# Patient Record
Sex: Male | Born: 1985 | Race: White | Hispanic: No | Marital: Married | Smoking: Never smoker
Health system: Southern US, Community
[De-identification: ages and names within clinical notes are randomized; demographics above are authoritative.]

## PROBLEM LIST (undated history)

## (undated) HISTORY — PX: BACK SURGERY: SHX140

---

## 2006-04-29 ENCOUNTER — Emergency Department (HOSPITAL_COMMUNITY): Admission: EM | Admit: 2006-04-29 | Discharge: 2006-04-29 | Payer: Self-pay | Admitting: Emergency Medicine

## 2008-01-29 ENCOUNTER — Ambulatory Visit (HOSPITAL_COMMUNITY): Admission: RE | Admit: 2008-01-29 | Discharge: 2008-01-29 | Payer: Self-pay | Admitting: Specialist

## 2010-12-28 ENCOUNTER — Other Ambulatory Visit: Payer: Self-pay | Admitting: Family Medicine

## 2010-12-28 DIAGNOSIS — IMO0002 Reserved for concepts with insufficient information to code with codable children: Secondary | ICD-10-CM

## 2010-12-28 DIAGNOSIS — M545 Low back pain: Secondary | ICD-10-CM

## 2010-12-29 ENCOUNTER — Ambulatory Visit
Admission: RE | Admit: 2010-12-29 | Discharge: 2010-12-29 | Disposition: A | Discharge status: Home / Self Care | Payer: PRIVATE HEALTH INSURANCE | Source: Ambulatory Visit | Attending: Family Medicine | Admitting: Family Medicine

## 2010-12-29 DIAGNOSIS — M545 Low back pain: Secondary | ICD-10-CM

## 2010-12-29 DIAGNOSIS — IMO0002 Reserved for concepts with insufficient information to code with codable children: Secondary | ICD-10-CM

## 2012-12-02 ENCOUNTER — Encounter (HOSPITAL_COMMUNITY): Payer: Self-pay | Admitting: *Deleted

## 2012-12-02 ENCOUNTER — Emergency Department (HOSPITAL_COMMUNITY)
Admission: EM | Admit: 2012-12-02 | Discharge: 2012-12-02 | Disposition: A | Discharge status: Home / Self Care | Payer: Self-pay | Attending: Emergency Medicine | Admitting: Emergency Medicine

## 2012-12-02 DIAGNOSIS — Y929 Unspecified place or not applicable: Secondary | ICD-10-CM | POA: Insufficient documentation

## 2012-12-02 DIAGNOSIS — S90569A Insect bite (nonvenomous), unspecified ankle, initial encounter: Secondary | ICD-10-CM | POA: Insufficient documentation

## 2012-12-02 DIAGNOSIS — Y939 Activity, unspecified: Secondary | ICD-10-CM | POA: Insufficient documentation

## 2012-12-02 DIAGNOSIS — R509 Fever, unspecified: Secondary | ICD-10-CM | POA: Insufficient documentation

## 2012-12-02 DIAGNOSIS — IMO0001 Reserved for inherently not codable concepts without codable children: Secondary | ICD-10-CM | POA: Insufficient documentation

## 2012-12-02 DIAGNOSIS — W57XXXA Bitten or stung by nonvenomous insect and other nonvenomous arthropods, initial encounter: Secondary | ICD-10-CM | POA: Insufficient documentation

## 2012-12-02 DIAGNOSIS — J029 Acute pharyngitis, unspecified: Secondary | ICD-10-CM | POA: Insufficient documentation

## 2012-12-02 DIAGNOSIS — R21 Rash and other nonspecific skin eruption: Secondary | ICD-10-CM | POA: Insufficient documentation

## 2012-12-02 DIAGNOSIS — B370 Candidal stomatitis: Secondary | ICD-10-CM | POA: Insufficient documentation

## 2012-12-02 LAB — URINALYSIS, ROUTINE W REFLEX MICROSCOPIC
Glucose, UA: NEGATIVE mg/dL
Leukocytes, UA: NEGATIVE
Nitrite: NEGATIVE
Urobilinogen, UA: >8 mg/dL — ABNORMAL HIGH (ref 0.0–1.0)

## 2012-12-02 LAB — COMPREHENSIVE METABOLIC PANEL
ALT: 37 U/L (ref 0–53)
Albumin: 3.6 g/dL (ref 3.5–5.2)
BUN: 8 mg/dL (ref 6–23)
GFR calc Af Amer: >90 mL/min (ref >90)
Glucose, Bld: 97 mg/dL (ref 70–99)
Potassium: 4 mEq/L (ref 3.5–5.1)
Sodium: 137 mEq/L (ref 135–145)
Total Bilirubin: 0.4 mg/dL (ref 0.3–1.2)

## 2012-12-02 LAB — CBC WITH DIFFERENTIAL/PLATELET
Basophils Absolute: 0 10*3/uL (ref 0.0–0.1)
Basophils Relative: 0 % (ref 0–1)
Eosinophils Absolute: 0.1 10*3/uL (ref 0.0–0.7)
HCT: 40.4 % (ref 39.0–52.0)
Lymphs Abs: 1.8 10*3/uL (ref 0.7–4.0)
MCH: 30.2 pg (ref 26.0–34.0)
MCHC: 35.4 g/dL (ref 30.0–36.0)
Monocytes Relative: 16 % — ABNORMAL HIGH (ref 3–12)
RBC: 4.74 MIL/uL (ref 4.22–5.81)
WBC: 6.4 10*3/uL (ref 4.0–10.5)

## 2012-12-02 MED ORDER — MAGIC MOUTHWASH W/LIDOCAINE: 5.0000 mL | Freq: Three times a day (TID) | ORAL | Status: DC

## 2012-12-02 MED ORDER — HYDROCODONE-ACETAMINOPHEN 5-325 MG PO TABS: 1.0000 | ORAL_TABLET | ORAL | Status: DC | PRN

## 2012-12-02 MED ORDER — PREDNISONE 20 MG PO TABS: 20.0000 mg | ORAL_TABLET | Freq: Two times a day (BID) | ORAL | Status: DC

## 2012-12-02 MED ORDER — DEXAMETHASONE SODIUM PHOSPHATE 10 MG/ML IJ SOLN
10.0000 mg | Freq: Once | INTRAMUSCULAR | Status: AC
  Administered 2012-12-02: 10 mg via INTRAVENOUS
  Filled 2012-12-02: qty 1

## 2012-12-02 MED ORDER — KETOROLAC TROMETHAMINE 30 MG/ML IJ SOLN
30.0000 mg | Freq: Once | INTRAMUSCULAR | Status: AC
  Administered 2012-12-02: 30 mg via INTRAVENOUS
  Filled 2012-12-02: qty 1

## 2012-12-02 MED ORDER — NYSTATIN 100000 UNIT/ML MT SUSP: 500000.0000 [IU] | Freq: Four times a day (QID) | OROMUCOSAL | Status: DC

## 2012-12-02 MED ORDER — MAGIC MOUTHWASH
5.0000 mL | Freq: Once | ORAL | Status: DC
  Filled 2012-12-02: qty 5

## 2012-12-02 MED ORDER — DOXYCYCLINE HYCLATE 100 MG PO CAPS: 100.0000 mg | ORAL_CAPSULE | Freq: Two times a day (BID) | ORAL | Status: DC

## 2012-12-02 NOTE — ED Notes (Signed)
The pt has had body aches sorethroat earache abd  Temp headache.  He has also been vomiting.  He has had these symptoms since last Wednesday.  He was seen at an urgent care and given an antibiotic but he is no better

## 2012-12-02 NOTE — ED Notes (Signed)
CBG 118 

## 2012-12-02 NOTE — ED Provider Notes (Signed)
History    CSN: [id number] Arrival date & time 12/02/12  1940  None    Chief Complaint  Patient presents with  . multiple complaints    (Consider location/radiation/quality/duration/timing/severity/associated sxs/prior Treatment) HPI History provided by pt.   Pt developed diffuse body aches and fever 6 days ago.  Max temp 103.  The next day he had vomiting and diarrhea, both of which have resolved.  The day after that, he developed a sore throat and discoloration of tongue.  He was evaluated at ED in [place] over the weekend, screened neg for strep phayngitis and treated w/ amoxicillin for viral illness.  He comes to ED today because he is not feeling any better despite compliance w/ abx; the odynophagia is particularly bothersome.  He denies cough, SOB, abd pain, urinary sx, rash.  He has no PMH.  Gets tick bites frequently.  No known sick contacts.  No recent travel.  Has one sexual partner and denies IV drug abuse.  History reviewed. No pertinent past medical history. History reviewed. No pertinent past surgical history. No family history on file. History  Substance Use Topics  . Smoking status: Never Smoker   . Smokeless tobacco: Not on file  . Alcohol Use: Yes    Review of Systems  All other systems reviewed and are negative.    Allergies  Review of patient's allergies indicates no known allergies.  Home Medications   Current Outpatient Rx  Name  Route  Sig  Dispense  Refill  . acetaminophen (TYLENOL) 500 MG tablet   Oral   Take 1,000 mg by mouth every 6 (six) hours as needed for pain. For pain         . amoxicillin (AMOXIL) 500 MG capsule   Oral   Take 500 mg by mouth 3 (three) times daily. Take for 10 days. Began on 11/29/12.         Marland Kitchen ibuprofen (ADVIL,MOTRIN) 200 MG tablet   Oral   Take 400 mg by mouth every 6 (six) hours as needed for pain. For pain         . doxycycline (VIBRAMYCIN) 100 MG capsule   Oral   Take 1 capsule (100 mg total) by mouth  2 (two) times daily.   20 capsule   0   . HYDROcodone-acetaminophen (NORCO/VICODIN) 5-325 MG per tablet   Oral   Take 1 tablet by mouth every 4 (four) hours as needed for pain.   15 tablet   0   . nystatin (MYCOSTATIN) 100000 UNIT/ML suspension   Oral   Take 5 mLs (500,000 Units total) by mouth 4 (four) times daily.   140 mL   0   . predniSONE (DELTASONE) 20 MG tablet   Oral   Take 1 tablet (20 mg total) by mouth 2 (two) times daily.   10 tablet   0    BP 117/76  Pulse 92  Temp(Src) 100.1 F (37.8 C) (Oral)  Resp 18  SpO2 98% Physical Exam  Nursing note and vitals reviewed. Constitutional: He is oriented to person, place, and time. He appears well-developed and well-nourished. No distress.  HENT:  Head: Normocephalic and atraumatic.  Thrush on borders of tongue.  Erythema soft palate, tonsils and posterior pharynx.  Exudate right tonsil.  No tonsillar edema.  Eyes:  Normal appearance  Neck: Normal range of motion.  Cardiovascular: Normal rate and regular rhythm.   Pulmonary/Chest: Effort normal and breath sounds normal. No respiratory distress.  Abdominal: Soft. Bowel sounds  are normal. He exhibits no distension. There is no tenderness.  Musculoskeletal: Normal range of motion.  Lymphadenopathy:    He has no cervical adenopathy.  Neurological: He is alert and oriented to person, place, and time.  Skin: Skin is warm and dry. No rash noted.  Multiple discrete, macular lesions, each ~0.5cm on palms of hands.  There are three tiny ticks, 1mm in size, on anterior left lower leg.  His girlfriend scraped them off while I was examining him.  No other obvious tick bites.   Psychiatric: He has a normal mood and affect. His behavior is normal.    ED Course  Procedures (including critical care time) Labs Reviewed  CBC WITH DIFFERENTIAL - Abnormal; Notable for the following:    Monocytes Relative 16 (*)    All other components within normal limits  COMPREHENSIVE METABOLIC  PANEL - Abnormal; Notable for the following:    Alkaline Phosphatase 150 (*)    All other components within normal limits  URINALYSIS, ROUTINE W REFLEX MICROSCOPIC - Abnormal; Notable for the following:    APPearance HAZY (*)    Bilirubin Urine SMALL (*)    Ketones, ur 15 (*)    Urobilinogen, UA >8.0 (*)    All other components within normal limits  GLUCOSE, CAPILLARY - Abnormal; Notable for the following:    Glucose-Capillary 118 (*)    All other components within normal limits  LIPASE, BLOOD  RAPID HIV SCREEN ([facility])   No results found. 1. Thrush   2. Tick bites   3. Pharyngitis     MDM  27yo previously healthy M presents w/ fever, myalgias, sore throat x 6d.  Currently being treated w/ amoxicillin w/out improvement in sx.  Exam sig for low grade fever, thrush of tongue, injection posterior pharynx and exudate of tonsils, macular rash of hands and tick bites to LLE.  Labs sig for elevated monocytes and alk phos.  Will treat empirically for tick-borne illness w/ doxy.  Pt will continue his amoxicillin for possible strep.  I recommended oral hydration, avoidance of kissing/sharing drinks as well as contact sports for possibility of mono, and tested him for HIV.  He received a dose of IV decadron and toradol in ED and I prescribed him oral nystatin, vicodin and prednisone.  Case discussed w/ Dr. Jeraldine Loots and he is agreement w/ plan.  Pt comfortable w/ plan as well.  Return precautions discussed.  11:46 PM   Otilio Miu, PA-C 12/02/12 2346  Otilio Miu, PA-C 12/02/12 [phone]

## 2012-12-03 LAB — RAPID HIV SCREEN (WH-MAU): Rapid HIV Screen: NONREACTIVE

## 2012-12-03 NOTE — ED Provider Notes (Signed)
  Medical screening examination/treatment/procedure(s) were performed by non-physician practitioner and as supervising physician I was immediately available for consultation/collaboration.    Gerhard Munch, MD 12/03/12 [phone]

## 2012-12-12 ENCOUNTER — Telehealth (HOSPITAL_COMMUNITY): Payer: Self-pay | Admitting: *Deleted

## 2015-01-21 ENCOUNTER — Other Ambulatory Visit: Payer: Self-pay | Admitting: Rehabilitation

## 2015-01-21 DIAGNOSIS — M5137 Other intervertebral disc degeneration, lumbosacral region: Secondary | ICD-10-CM

## 2015-02-04 ENCOUNTER — Ambulatory Visit
Admission: RE | Admit: 2015-02-04 | Discharge: 2015-02-04 | Disposition: A | Discharge status: Home / Self Care | Payer: 59 | Source: Ambulatory Visit | Attending: Rehabilitation | Admitting: Rehabilitation

## 2015-02-04 DIAGNOSIS — M5137 Other intervertebral disc degeneration, lumbosacral region: Secondary | ICD-10-CM

## 2015-06-17 ENCOUNTER — Telehealth: Payer: Self-pay | Admitting: Vascular Surgery

## 2015-06-17 NOTE — Telephone Encounter (Signed)
-----   Message from Glori Bickers, RN sent at 06/16/2015  3:45 PM EST ----- Regarding: OV with [facility] Mr. Galluzzo is scheduled for ALIF L5-S1 on Tuesday, 2/21, with Drs. Pool and [employer]. Will you please schedule an OV with Dr. Edilia Bo prior to this surgery date? Also, patient will need to bring films to appointment.   Thanks, Judeth Cornfield

## 2015-06-17 NOTE — Telephone Encounter (Signed)
Spoke with pt, dpm °

## 2015-06-18 ENCOUNTER — Other Ambulatory Visit (HOSPITAL_COMMUNITY): Payer: Self-pay | Admitting: Neurosurgery

## 2015-06-24 ENCOUNTER — Encounter: Payer: Self-pay | Admitting: Vascular Surgery

## 2015-06-25 ENCOUNTER — Other Ambulatory Visit (HOSPITAL_COMMUNITY): Payer: Self-pay

## 2015-06-28 ENCOUNTER — Inpatient Hospital Stay (HOSPITAL_COMMUNITY): Admission: RE | Admit: 2015-06-28 | Payer: Self-pay | Source: Ambulatory Visit

## 2015-06-28 ENCOUNTER — Telehealth: Payer: Self-pay | Admitting: Vascular Surgery

## 2015-06-28 NOTE — Telephone Encounter (Signed)
-----   Message from Glori Bickers, RN sent at 06/25/2015  3:58 PM EST ----- Regarding: ALIF 07/08/15 FYI: Erie Noe from Dr. Ethelene Browns office called stating that Mr. Grygiel ALIF has been cancelled on 07/08/15. It was denied by the patients insurance.   Can we cancel his OV with Dr. Edilia Bo scheduled for 2/15?  Thanks, Judeth Cornfield

## 2015-06-29 ENCOUNTER — Encounter: Payer: Self-pay | Admitting: Vascular Surgery

## 2015-06-30 ENCOUNTER — Encounter: Payer: Self-pay | Admitting: Vascular Surgery

## 2015-07-08 ENCOUNTER — Inpatient Hospital Stay (HOSPITAL_COMMUNITY): Admission: RE | Admit: 2015-07-08 | Payer: BLUE CROSS/BLUE SHIELD | Source: Ambulatory Visit | Admitting: Neurosurgery

## 2015-07-08 ENCOUNTER — Encounter (HOSPITAL_COMMUNITY): Admission: RE | Payer: Self-pay | Source: Ambulatory Visit

## 2015-07-08 SURGERY — ANTERIOR LUMBAR FUSION 1 LEVEL
Anesthesia: General

## 2016-04-28 ENCOUNTER — Emergency Department (HOSPITAL_COMMUNITY): Payer: BLUE CROSS/BLUE SHIELD

## 2016-04-28 ENCOUNTER — Encounter (HOSPITAL_COMMUNITY): Payer: Self-pay | Admitting: Emergency Medicine

## 2016-04-28 ENCOUNTER — Emergency Department (HOSPITAL_COMMUNITY)
Admission: EM | Admit: 2016-04-28 | Discharge: 2016-04-28 | Disposition: A | Discharge status: Home / Self Care | Payer: BLUE CROSS/BLUE SHIELD | Attending: Emergency Medicine | Admitting: Emergency Medicine

## 2016-04-28 DIAGNOSIS — K219 Gastro-esophageal reflux disease without esophagitis: Secondary | ICD-10-CM | POA: Diagnosis not present

## 2016-04-28 DIAGNOSIS — R079 Chest pain, unspecified: Secondary | ICD-10-CM | POA: Insufficient documentation

## 2016-04-28 DIAGNOSIS — R0602 Shortness of breath: Secondary | ICD-10-CM | POA: Diagnosis present

## 2016-04-28 LAB — D-DIMER, QUANTITATIVE: D-Dimer, Quant: 0.36 ug/mL-FEU (ref 0.00–0.50)

## 2016-04-28 LAB — COMPREHENSIVE METABOLIC PANEL
ALK PHOS: 57 U/L (ref 38–126)
ALT: 22 U/L (ref 17–63)
AST: 29 U/L (ref 15–41)
Albumin: 3.8 g/dL (ref 3.5–5.0)
Anion gap: 11 (ref 5–15)
BUN: 11 mg/dL (ref 6–20)
CHLORIDE: 101 mmol/L (ref 101–111)
CO2: 24 mmol/L (ref 22–32)
CREATININE: 1.02 mg/dL (ref 0.61–1.24)
Calcium: 9.3 mg/dL (ref 8.9–10.3)
GFR calc Af Amer: >60 mL/min (ref >60)
GFR calc non Af Amer: >60 mL/min (ref >60)
GLUCOSE: 160 mg/dL — AB (ref 65–99)
Potassium: 3.4 mmol/L — ABNORMAL LOW (ref 3.5–5.1)
SODIUM: 136 mmol/L (ref 135–145)
Total Bilirubin: 0.4 mg/dL (ref 0.3–1.2)
Total Protein: 6.3 g/dL — ABNORMAL LOW (ref 6.5–8.1)

## 2016-04-28 LAB — CBC WITH DIFFERENTIAL/PLATELET
BASOS ABS: 0 10*3/uL (ref 0.0–0.1)
Basophils Relative: 0 %
EOS ABS: 0.1 10*3/uL (ref 0.0–0.7)
EOS PCT: 1 %
HCT: 40 % (ref 39.0–52.0)
HEMOGLOBIN: 13.9 g/dL (ref 13.0–17.0)
LYMPHS PCT: 8 %
Lymphs Abs: 0.8 10*3/uL (ref 0.7–4.0)
MCH: 30 pg (ref 26.0–34.0)
MCHC: 34.8 g/dL (ref 30.0–36.0)
MCV: 86.2 fL (ref 78.0–100.0)
Monocytes Absolute: 0.8 10*3/uL (ref 0.1–1.0)
Monocytes Relative: 8 %
NEUTROS PCT: 83 %
Neutro Abs: 8.3 10*3/uL — ABNORMAL HIGH (ref 1.7–7.7)
PLATELETS: 189 10*3/uL (ref 150–400)
RBC: 4.64 MIL/uL (ref 4.22–5.81)
RDW: 12.6 % (ref 11.5–15.5)
WBC: 10 10*3/uL (ref 4.0–10.5)

## 2016-04-28 LAB — I-STAT TROPONIN, ED: Troponin i, poc: 0 ng/mL (ref 0.00–0.08)

## 2016-04-28 LAB — LIPASE, BLOOD: Lipase: 17 U/L (ref 11–51)

## 2016-04-28 MED ORDER — FAMOTIDINE IN NACL 20-0.9 MG/50ML-% IV SOLN
20.0000 mg | Freq: Once | INTRAVENOUS | Status: AC
  Administered 2016-04-28: 20 mg via INTRAVENOUS
  Filled 2016-04-28: qty 50

## 2016-04-28 MED ORDER — PANTOPRAZOLE SODIUM 20 MG PO TBEC: 20.0000 mg | DELAYED_RELEASE_TABLET | Freq: Every day | ORAL | 3 refills | Status: AC

## 2016-04-28 MED ORDER — LORAZEPAM 2 MG/ML IJ SOLN
1.0000 mg | Freq: Once | INTRAMUSCULAR | Status: AC
  Administered 2016-04-28: 1 mg via INTRAVENOUS
  Filled 2016-04-28: qty 1

## 2016-04-28 MED ORDER — SUCRALFATE 1 GM/10ML PO SUSP: 1.0000 g | Freq: Three times a day (TID) | ORAL | 0 refills | Status: AC

## 2016-04-28 MED ORDER — GI COCKTAIL ~~LOC~~
30.0000 mL | Freq: Once | ORAL | Status: AC
  Administered 2016-04-28: 30 mL via ORAL
  Filled 2016-04-28: qty 30

## 2016-04-28 NOTE — ED Triage Notes (Addendum)
Pt stated he was woken up by the chest pain. Pt states he has a back problems and has medication for same. He took some of the medication and it took the edge off the pain. Pt just started a new medication for his back called duloxetine HCL and he took it at 2000 hrs on 04/27/16. 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

## 2016-04-28 NOTE — ED Notes (Signed)
Patient transported to X-ray 

## 2016-04-28 NOTE — ED Provider Notes (Signed)
[facility]-EMERGENCY DEPT Provider Note   CSN: [id number] Arrival date & time: 04/28/16  0359     History   Chief Complaint Chief Complaint  Patient presents with  . Chest Pain    HPI Nathan Nichols is a 30 y.o. male.  Patient presents to the emergency department for evaluation of chest pain. Patient reports that he was awakened by a heaviness in the center of his chest. He reports associated shortness of breath. He has a history of chronic back pain, took his normal dose of hydrocodone and it helped the pain but he is still feeling some pain. Patient reports that he was sick all day yesterday with nausea and vomiting, none today.      History reviewed. No pertinent past medical history.  There are no active problems to display for this patient.   History reviewed. No pertinent surgical history.     Home Medications    Prior to Admission medications   Medication Sig Start Date End Date Taking? Authorizing Provider  DULoxetine (CYMBALTA) 30 MG capsule Take 30 mg by mouth daily. 04/19/16  Yes Historical Provider, MD  HYDROcodone-acetaminophen (NORCO) 10-325 MG tablet Take 1 tablet by mouth every 6 (six) hours as needed for moderate pain.  04/17/16  Yes Historical Provider, MD  pantoprazole (PROTONIX) 20 MG tablet Take 1 tablet (20 mg total) by mouth daily. 04/28/16   Gilda Creasehristopher J Emalynn Clewis, MD  sucralfate (CARAFATE) 1 GM/10ML suspension Take 10 mLs (1 g total) by mouth 4 (four) times daily -  with meals and at bedtime. 04/28/16   Gilda Creasehristopher J Zophia Marrone, MD    Family History No family history on file.  Social History Social History  Substance Use Topics  . Smoking status: Never Smoker  . Smokeless tobacco: Current User    Types: Snuff  . Alcohol use Yes     Allergies   Patient has no known allergies.   Review of Systems Review of Systems  Respiratory: Positive for shortness of breath.   Cardiovascular: Positive for chest pain.  Gastrointestinal: Positive for  nausea and vomiting.  All other systems reviewed and are negative.    Physical Exam Updated Vital Signs BP 113/71   Pulse 90   Temp 98.4 F (36.9 C) (Oral)   Resp 14   Ht 5\' 11"  (1.803 m)   Wt 148 lb (67.1 kg)   SpO2 92%   BMI 20.64 kg/m   Physical Exam  Constitutional: He is oriented to person, place, and time. He appears well-developed and well-nourished. No distress.  HENT:  Head: Normocephalic and atraumatic.  Right Ear: Hearing normal.  Left Ear: Hearing normal.  Nose: Nose normal.  Mouth/Throat: Oropharynx is clear and moist and mucous membranes are normal.  Eyes: Conjunctivae and EOM are normal. Pupils are equal, round, and reactive to light.  Neck: Normal range of motion. Neck supple.  Cardiovascular: Regular rhythm, S1 normal and S2 normal.  Exam reveals no gallop and no friction rub.   No murmur heard. Pulmonary/Chest: Effort normal and breath sounds normal. No respiratory distress. He exhibits no tenderness.  Abdominal: Soft. Normal appearance and bowel sounds are normal. There is no hepatosplenomegaly. There is no tenderness. There is no rebound, no guarding, no tenderness at McBurney's point and negative Murphy's sign. No hernia.  Musculoskeletal: Normal range of motion.  Neurological: He is alert and oriented to person, place, and time. He has normal strength. No cranial nerve deficit or sensory deficit. Coordination normal. GCS eye subscore is 4. GCS  verbal subscore is 5. GCS motor subscore is 6.  Skin: Skin is warm, dry and intact. No rash noted. No cyanosis.  Psychiatric: He has a normal mood and affect. His speech is normal and behavior is normal. Thought content normal.  Nursing note and vitals reviewed.    ED Treatments / Results  Labs (all labs ordered are listed, but only abnormal results are displayed) Labs Reviewed  CBC WITH DIFFERENTIAL/PLATELET - Abnormal; Notable for the following:       Result Value   Neutro Abs 8.3 (*)    All other  components within normal limits  COMPREHENSIVE METABOLIC PANEL - Abnormal; Notable for the following:    Potassium 3.4 (*)    Glucose, Bld 160 (*)    Total Protein 6.3 (*)    All other components within normal limits  LIPASE, BLOOD  D-DIMER, QUANTITATIVE (NOT AT [facility])  I-STAT TROPOININ, ED    EKG  EKG Interpretation  Date/Time:  Friday April 28 2016 04:04:00 EST Ventricular Rate:  102 PR Interval:    QRS Duration: 83 QT Interval:  327 QTC Calculation: 426 R Axis:   71 Text Interpretation:  Sinus tachycardia Otherwise within normal limits Confirmed by Poseidon Pam  MD, Lessie Funderburke [phone]) on 04/28/2016 4:20:06 AM       Radiology Dg Chest 2 View  Result Date: 04/28/2016 CLINICAL DATA:  30 year old male with chest pain EXAM: CHEST  2 VIEW COMPARISON:  Chest radiograph dated 04/29/2006 FINDINGS: The lungs are clear. There is no pleural effusion or pneumothorax. The cardiac silhouette is within normal limits. Stable compression deformity and anterior wedging of the T8 vertebra. No acute osseous pathology. IMPRESSION: No active cardiopulmonary disease. Electronically Signed   By: Elgie Collard M.D.   On: 04/28/2016 04:59    Procedures Procedures (including critical care time)  Medications Ordered in ED Medications  famotidine (PEPCID) IVPB 20 mg premix (0 mg Intravenous Stopped 04/28/16 0522)  gi cocktail (Maalox,Lidocaine,Donnatal) (30 mLs Oral Given 04/28/16 0419)  LORazepam (ATIVAN) injection 1 mg (1 mg Intravenous Given 04/28/16 0536)     Initial Impression / Assessment and Plan / ED Course  I have reviewed the triage vital signs and the nursing notes.  Pertinent labs & imaging results that were available during my care of the patient were reviewed by me and considered in my medical decision making (see chart for details).  Clinical Course     Patient presents to the department with complaints of chest pain. Patient reports the pain began overnight, awakens him from  sleep. He has had a similar episode years ago secondary to GERD. He was previously on Protonix, but has not been on it for a while, has not had any reflux issues.  Patient does not have any cardiac risk factors. EKG does not show evidence of ischemia or infarct. Troponin is negative. Patient also has a negative d-dimer. Patient administered GI cocktail and Pepcid. He continued to have discomfort. Patient appeared to be very anxious. He was given Ativan and his symptoms have now resolved. Suspected there was reflux with some element of esophageal spasm. Patient appropriate for discharge. Heart Score is 0.  Final Clinical Impressions(s) / ED Diagnoses   Final diagnoses:  Chest pain, unspecified type  Gastroesophageal reflux disease, esophagitis presence not specified    New Prescriptions New Prescriptions   PANTOPRAZOLE (PROTONIX) 20 MG TABLET    Take 1 tablet (20 mg total) by mouth daily.   SUCRALFATE (CARAFATE) 1 GM/10ML SUSPENSION    Take 10  mLs (1 g total) by mouth 4 (four) times daily -  with meals and at bedtime.     Gilda Creasehristopher J Antwoine Zorn, MD 04/28/16 [phone]

## 2017-05-22 DIAGNOSIS — M5417 Radiculopathy, lumbosacral region: Secondary | ICD-10-CM | POA: Diagnosis not present

## 2017-05-22 DIAGNOSIS — M5137 Other intervertebral disc degeneration, lumbosacral region: Secondary | ICD-10-CM | POA: Diagnosis not present

## 2017-06-25 DIAGNOSIS — M5137 Other intervertebral disc degeneration, lumbosacral region: Secondary | ICD-10-CM | POA: Diagnosis not present

## 2017-06-25 DIAGNOSIS — M5417 Radiculopathy, lumbosacral region: Secondary | ICD-10-CM | POA: Diagnosis not present

## 2017-06-25 DIAGNOSIS — R030 Elevated blood-pressure reading, without diagnosis of hypertension: Secondary | ICD-10-CM | POA: Diagnosis not present

## 2017-07-19 DIAGNOSIS — M5417 Radiculopathy, lumbosacral region: Secondary | ICD-10-CM | POA: Diagnosis not present

## 2017-08-09 DIAGNOSIS — R030 Elevated blood-pressure reading, without diagnosis of hypertension: Secondary | ICD-10-CM | POA: Diagnosis not present

## 2017-08-09 DIAGNOSIS — M5417 Radiculopathy, lumbosacral region: Secondary | ICD-10-CM | POA: Diagnosis not present

## 2017-08-09 DIAGNOSIS — M5137 Other intervertebral disc degeneration, lumbosacral region: Secondary | ICD-10-CM | POA: Diagnosis not present

## 2017-09-13 DIAGNOSIS — M5417 Radiculopathy, lumbosacral region: Secondary | ICD-10-CM | POA: Diagnosis not present

## 2017-09-13 DIAGNOSIS — Z135 Encounter for screening for eye and ear disorders: Secondary | ICD-10-CM | POA: Diagnosis not present

## 2017-09-13 DIAGNOSIS — M545 Low back pain: Secondary | ICD-10-CM | POA: Diagnosis not present

## 2017-09-14 DIAGNOSIS — R030 Elevated blood-pressure reading, without diagnosis of hypertension: Secondary | ICD-10-CM | POA: Diagnosis not present

## 2017-09-18 DIAGNOSIS — M48062 Spinal stenosis, lumbar region with neurogenic claudication: Secondary | ICD-10-CM | POA: Diagnosis not present

## 2017-09-18 DIAGNOSIS — R030 Elevated blood-pressure reading, without diagnosis of hypertension: Secondary | ICD-10-CM | POA: Diagnosis not present

## 2018-01-04 DIAGNOSIS — M5137 Other intervertebral disc degeneration, lumbosacral region: Secondary | ICD-10-CM | POA: Diagnosis not present

## 2018-01-04 DIAGNOSIS — R030 Elevated blood-pressure reading, without diagnosis of hypertension: Secondary | ICD-10-CM | POA: Diagnosis not present

## 2018-01-04 DIAGNOSIS — M48062 Spinal stenosis, lumbar region with neurogenic claudication: Secondary | ICD-10-CM | POA: Diagnosis not present

## 2018-02-12 DIAGNOSIS — Z0000 Encounter for general adult medical examination without abnormal findings: Secondary | ICD-10-CM | POA: Diagnosis not present

## 2018-02-12 DIAGNOSIS — Z23 Encounter for immunization: Secondary | ICD-10-CM | POA: Diagnosis not present

## 2018-02-12 DIAGNOSIS — R030 Elevated blood-pressure reading, without diagnosis of hypertension: Secondary | ICD-10-CM | POA: Diagnosis not present

## 2018-02-13 DIAGNOSIS — Z0000 Encounter for general adult medical examination without abnormal findings: Secondary | ICD-10-CM | POA: Diagnosis not present

## 2018-02-26 DIAGNOSIS — M4807 Spinal stenosis, lumbosacral region: Secondary | ICD-10-CM | POA: Diagnosis not present

## 2018-02-26 DIAGNOSIS — M48062 Spinal stenosis, lumbar region with neurogenic claudication: Secondary | ICD-10-CM | POA: Diagnosis not present

## 2018-02-26 DIAGNOSIS — Z01812 Encounter for preprocedural laboratory examination: Secondary | ICD-10-CM | POA: Diagnosis not present

## 2018-02-26 DIAGNOSIS — M5137 Other intervertebral disc degeneration, lumbosacral region: Secondary | ICD-10-CM | POA: Diagnosis not present

## 2018-03-05 DIAGNOSIS — M5417 Radiculopathy, lumbosacral region: Secondary | ICD-10-CM | POA: Diagnosis not present

## 2018-03-05 DIAGNOSIS — M5127 Other intervertebral disc displacement, lumbosacral region: Secondary | ICD-10-CM | POA: Diagnosis not present

## 2018-03-05 DIAGNOSIS — M5117 Intervertebral disc disorders with radiculopathy, lumbosacral region: Secondary | ICD-10-CM | POA: Diagnosis not present

## 2018-03-05 DIAGNOSIS — M4807 Spinal stenosis, lumbosacral region: Secondary | ICD-10-CM | POA: Diagnosis not present

## 2018-03-10 ENCOUNTER — Emergency Department (HOSPITAL_COMMUNITY): Payer: BLUE CROSS/BLUE SHIELD

## 2018-03-10 ENCOUNTER — Other Ambulatory Visit: Payer: Self-pay

## 2018-03-10 ENCOUNTER — Emergency Department (HOSPITAL_COMMUNITY)
Admission: EM | Admit: 2018-03-10 | Discharge: 2018-03-10 | Disposition: A | Discharge status: Home / Self Care | Payer: BLUE CROSS/BLUE SHIELD | Attending: Emergency Medicine | Admitting: Emergency Medicine

## 2018-03-10 ENCOUNTER — Encounter (HOSPITAL_COMMUNITY): Payer: Self-pay

## 2018-03-10 DIAGNOSIS — K5900 Constipation, unspecified: Secondary | ICD-10-CM

## 2018-03-10 DIAGNOSIS — Z79899 Other long term (current) drug therapy: Secondary | ICD-10-CM | POA: Diagnosis not present

## 2018-03-10 DIAGNOSIS — K5641 Fecal impaction: Secondary | ICD-10-CM | POA: Diagnosis not present

## 2018-03-10 DIAGNOSIS — R1110 Vomiting, unspecified: Secondary | ICD-10-CM | POA: Insufficient documentation

## 2018-03-10 DIAGNOSIS — F5089 Other specified eating disorder: Secondary | ICD-10-CM | POA: Diagnosis not present

## 2018-03-10 LAB — CBC WITH DIFFERENTIAL/PLATELET
Abs Immature Granulocytes: 0.07 10*3/uL (ref 0.00–0.07)
Basophils Absolute: 0 10*3/uL (ref 0.0–0.1)
Basophils Relative: 0 %
EOS PCT: 1 %
Eosinophils Absolute: 0.1 10*3/uL (ref 0.0–0.5)
HEMATOCRIT: 39 % (ref 39.0–52.0)
HEMOGLOBIN: 12.7 g/dL — AB (ref 13.0–17.0)
Immature Granulocytes: 1 %
Lymphocytes Relative: 17 %
Lymphs Abs: 1.8 10*3/uL (ref 0.7–4.0)
MCH: 29.3 pg (ref 26.0–34.0)
MCHC: 32.6 g/dL (ref 30.0–36.0)
MCV: 90.1 fL (ref 80.0–100.0)
Monocytes Absolute: 1 10*3/uL (ref 0.1–1.0)
Monocytes Relative: 9 %
NEUTROS PCT: 72 %
NRBC: 0 % (ref 0.0–0.2)
Neutro Abs: 7.8 10*3/uL — ABNORMAL HIGH (ref 1.7–7.7)
PLATELETS: 284 10*3/uL (ref 150–400)
RBC: 4.33 MIL/uL (ref 4.22–5.81)
RDW: 12.2 % (ref 11.5–15.5)
WBC: 10.7 10*3/uL — AB (ref 4.0–10.5)

## 2018-03-10 LAB — COMPREHENSIVE METABOLIC PANEL
ALT: 72 U/L — AB (ref 0–44)
AST: 19 U/L (ref 15–41)
Albumin: 3.8 g/dL (ref 3.5–5.0)
Alkaline Phosphatase: 96 U/L (ref 38–126)
Anion gap: 8 (ref 5–15)
BUN: 20 mg/dL (ref 6–20)
CO2: 28 mmol/L (ref 22–32)
Calcium: 9.2 mg/dL (ref 8.9–10.3)
Chloride: 102 mmol/L (ref 98–111)
Creatinine, Ser: 0.87 mg/dL (ref 0.61–1.24)
Glucose, Bld: 96 mg/dL (ref 70–99)
POTASSIUM: 4 mmol/L (ref 3.5–5.1)
SODIUM: 138 mmol/L (ref 135–145)
Total Bilirubin: 0.7 mg/dL (ref 0.3–1.2)
Total Protein: 7.2 g/dL (ref 6.5–8.1)

## 2018-03-10 LAB — LIPASE, BLOOD: LIPASE: 25 U/L (ref 11–51)

## 2018-03-10 MED ORDER — MAGNESIUM CITRATE PO SOLN: 1.0000 | Freq: Once | ORAL | 0 refills | Status: AC

## 2018-03-10 MED ORDER — POLYETHYLENE GLYCOL 3350 17 G PO PACK: 17.0000 g | PACK | Freq: Every day | ORAL | 0 refills | Status: DC

## 2018-03-10 MED ORDER — MAGNESIUM CITRATE PO SOLN: 1.0000 | Freq: Once | ORAL | 0 refills | Status: DC

## 2018-03-10 MED ORDER — HYDROMORPHONE HCL 1 MG/ML IJ SOLN
1.0000 mg | Freq: Once | INTRAMUSCULAR | Status: AC
  Administered 2018-03-10: 1 mg via INTRAVENOUS
  Filled 2018-03-10: qty 1

## 2018-03-10 MED ORDER — POLYETHYLENE GLYCOL 3350 17 G PO PACK: 17.0000 g | PACK | Freq: Every day | ORAL | 0 refills | Status: AC

## 2018-03-10 MED ORDER — OXYCODONE-ACETAMINOPHEN 5-325 MG PO TABS: 2.0000 | ORAL_TABLET | ORAL | 0 refills | Status: AC | PRN

## 2018-03-10 MED ORDER — SODIUM CHLORIDE 0.9 % IV BOLUS
1000.0000 mL | Freq: Once | INTRAVENOUS | Status: AC
  Administered 2018-03-10: 1000 mL via INTRAVENOUS

## 2018-03-10 MED ORDER — OXYCODONE-ACETAMINOPHEN 5-325 MG PO TABS: 2.0000 | ORAL_TABLET | ORAL | 0 refills | Status: DC | PRN

## 2018-03-10 MED ORDER — IOPAMIDOL (ISOVUE-300) INJECTION 61%
100.0000 mL | Freq: Once | INTRAVENOUS | Status: AC | PRN
  Administered 2018-03-10: 100 mL via INTRAVENOUS

## 2018-03-10 MED ORDER — MAGNESIUM CITRATE PO SOLN
1.0000 | Freq: Once | ORAL | Status: DC
Start: 2018-03-10 — End: 2018-03-10

## 2018-03-10 MED ORDER — OXYCODONE-ACETAMINOPHEN 5-325 MG PO TABS
2.0000 | ORAL_TABLET | Freq: Once | ORAL | Status: AC
  Administered 2018-03-10: 2 via ORAL
  Filled 2018-03-10: qty 2

## 2018-03-10 NOTE — ED Notes (Signed)
Pt transferred to CT.

## 2018-03-10 NOTE — Discharge Instructions (Addendum)
I have provided a prescription for magnesium citrate please drink the whole bottle today to help with constipation. I have also provided a script for Miralax which you should use regularly daily for the next 7 days. I have also provided a prescription for the percocet, please call your surgeon to get this medication fully refilled. Know that this medication is not helping the constipation and could be worsening your constipation.

## 2018-03-10 NOTE — ED Provider Notes (Signed)
[facility] EMERGENCY DEPARTMENT Provider Note   CSN: [id number] Arrival date & time: 03/10/18  0701     History   Chief Complaint Chief Complaint  Patient presents with  . Constipation  . Emesis    HPI Nathan Nichols is a 32 y.o. male.  32 y.o male with no PMH presents to the ED with a chief complaint of constipation x 6 days.  Patient had a lumbar fusion done on Tuesday 03/05/2018 by Dr. Dutch Quint and reports he was prescribed Percocet every 4 hours along with Valium every 6 hours for his pain.  She was also discharged on Colace and has been taking it daily.  Reports being unable to have a bowel movement since Monday prior to the surgery.  He contacted his physician yesterday who advised him to take Dulcolax x2 for his constipation. Patient reports emesis since yesterday, he reports he is unable to have any intake or liquids or solids without vomiting them.  Reports he is not passing gas at this point.  Denies any fever, urinary symptoms, chest pain, or shortness of breath.     History reviewed. No pertinent past medical history.  There are no active problems to display for this patient.   Past Surgical History:  Procedure Laterality Date  . BACK SURGERY          Home Medications    Prior to Admission medications   Medication Sig Start Date End Date Taking? Authorizing Provider  DULoxetine (CYMBALTA) 30 MG capsule Take 30 mg by mouth daily. 04/19/16   [provider]  HYDROcodone-acetaminophen (NORCO) 10-325 MG tablet Take 1 tablet by mouth every 6 (six) hours as needed for moderate pain.  04/17/16   [provider]  magnesium citrate SOLN Take 296 mLs (1 Bottle total) by mouth once for 1 dose. 03/10/18 03/10/18  Claude Manges, PA-C  oxyCODONE-acetaminophen (PERCOCET/ROXICET) 5-325 MG tablet Take 2 tablets by mouth every 4 (four) hours as needed for up to 3 days for severe pain. 03/10/18 03/13/18  Claude Manges, PA-C  pantoprazole (PROTONIX) 20 MG tablet  Take 1 tablet (20 mg total) by mouth daily. 04/28/16   Gilda Crease, MD  polyethylene glycol [facility]) packet Take 17 g by mouth daily for 7 days. 03/10/18 03/17/18  Claude Manges, PA-C  sucralfate (CARAFATE) 1 GM/10ML suspension Take 10 mLs (1 g total) by mouth 4 (four) times daily -  with meals and at bedtime. 04/28/16   Gilda Crease, MD    Family History No family history on file.  Social History Social History   Tobacco Use  . Smoking status: Never Smoker  . Smokeless tobacco: Current User    Types: Snuff  Substance Use Topics  . Alcohol use: Yes  . Drug use: Not on file     Allergies   Patient has no known allergies.   Review of Systems Review of Systems  Constitutional: Negative for chills and fever.  Respiratory: Negative for shortness of breath.   Cardiovascular: Negative for chest pain.  Gastrointestinal: Positive for abdominal pain (generalized), constipation, nausea and vomiting. Negative for diarrhea.  Genitourinary: Negative for dysuria, flank pain and hematuria.  Musculoskeletal: Positive for back pain (post Lumbar fusion ).  Skin: Negative for pallor and wound.  Neurological: Negative for syncope and headaches.  All other systems reviewed and are negative.    Physical Exam Updated Vital Signs BP 108/73   Pulse 75   Temp 97.9 F (36.6 C) (Oral)   Resp 18  Ht 5\' 11"  (1.803 m)   Wt 77.1 kg   SpO2 99%   BMI 23.71 kg/m   Physical Exam  Constitutional: He is oriented to person, place, and time. He appears well-developed and well-nourished.  HENT:  Head: Normocephalic and atraumatic.  Neck: Normal range of motion. Neck supple.  Cardiovascular: Normal heart sounds.  Pulmonary/Chest: Breath sounds normal. He has no wheezes.  Abdominal: Normal appearance. Bowel sounds are decreased. There is tenderness in the periumbilical area. There is no rigidity, no guarding, no CVA tenderness and no tenderness at McBurney's point.     Musculoskeletal: He exhibits tenderness.  Neurological: He is alert and oriented to person, place, and time.  Skin: Skin is warm and dry.  Nursing note and vitals reviewed.    ED Treatments / Results  Labs (all labs ordered are listed, but only abnormal results are displayed) Labs Reviewed  CBC WITH DIFFERENTIAL/PLATELET - Abnormal; Notable for the following components:      Result Value   WBC 10.7 (*)    Hemoglobin 12.7 (*)    Neutro Abs 7.8 (*)    All other components within normal limits  COMPREHENSIVE METABOLIC PANEL - Abnormal; Notable for the following components:   ALT 72 (*)    All other components within normal limits  LIPASE, BLOOD  URINALYSIS, ROUTINE W REFLEX MICROSCOPIC    EKG None  Radiology Ct Abdomen Pelvis W Contrast  Result Date: 03/10/2018 CLINICAL DATA:  Back surgery 6 days ago now with constipation and vomiting. EXAM: CT ABDOMEN AND PELVIS WITH CONTRAST TECHNIQUE: Multidetector CT imaging of the abdomen and pelvis was performed using the standard protocol following bolus administration of intravenous contrast. CONTRAST:  ISOVUE-300 IOPAMIDOL (ISOVUE-300) INJECTION 61% COMPARISON:  None. FINDINGS: Lower chest: Limited visualization of the lower thorax demonstrates minimal dependent subpleural ground-glass atelectasis. No discrete focal airspace opacities. No pleural effusion. Normal heart size.  No pericardial effusion. Hepatobiliary: Normal hepatic contour. There is a minimal amount of focal fatty infiltration adjacent to the fissure for the ligamentum teres. Note is made the freezing calf about the gallbladder. Normal appearance of the otherwise normal appearance of the gallbladder. No radiopaque gallstones. No gallbladder wall thickening or pericholecystic fluid. No ascites. Pancreas: Normal appearance of the pancreas Spleen: Normal appearance of the spleen. Note is made of a small splenule. Adrenals/Urinary Tract: There is symmetric enhancement of  the bilateral kidneys. No definite renal stones on this postcontrast examination. No discrete renal lesions. No urine obstruction or perinephric stranding. Normal appearance the bilateral adrenal glands. Normal appearance of the urinary bladder given degree distention. Stomach/Bowel: Large colonic stool burden without evidence of enteric obstruction. Normal appearance of the terminal ileum and retrocecal appendix. The bowel is normal in course and caliber without wall thickening. No pneumoperitoneum, pneumatosis or portal venous gas. Vascular/Lymphatic: Normal caliber of the abdominal aorta. The major branch vessels of the abdominal aorta appear patent on this non CTA examination. No bulky retroperitoneal, mesenteric, pelvic or inguinal lymphadenopathy. Reproductive: Normal appearance of the prostate gland. No free fluid in the pelvic cul-de-sac. Other: Tiny mesenteric fat containing periumbilical hernia. Musculoskeletal: Post L5 laminectomy and L5-S1 paraspinal fusion and intervertebral disc space replacement with subcutaneous stranding and scattered foci of postoperative subcutaneous emphysema about the operative site. No definitive definable/drainable fluid collection. IMPRESSION: 1. Large colonic stool burden without evidence of enteric obstruction. 2. Sequela of recent L5 laminectomy, L5-S1 paraspinal fusion and intervertebral disc space replacement with associated adjacent postoperative change and subcutaneous emphysema. No definable/drainable fluid  collection. Electronically Signed   By: Simonne Come M.D.   On: 03/10/2018 10:00    Procedures Fecal disimpaction Date/Time: 03/10/2018 10:22 AM Performed by: Claude Manges, PA-C Authorized by: Claude Manges, PA-C  Consent: Verbal consent obtained. Consent given by: patient Patient understanding: patient states understanding of the procedure being performed Patient consent: the patient's understanding of the procedure matches consent given Procedure  consent: procedure consent matches procedure scheduled Relevant documents: relevant documents present and verified Test results: test results available and properly labeled Patient identity confirmed: verbally with patient Local anesthesia used: no  Anesthesia: Local anesthesia used: no  Sedation: Patient sedated: no  Patient tolerance: Patient tolerated the procedure well with no immediate complications Comments: Removed small part of stool from vault, unable to remove stool.     (including critical care time)  Medications Ordered in ED Medications  HYDROmorphone (DILAUDID) injection 1 mg (1 mg Intravenous Given 03/10/18 0833)  sodium chloride 0.9 % bolus 1,000 mL ( Intravenous Stopped 03/10/18 0917)  iopamidol (ISOVUE-300) 61 % injection 100 mL (100 mLs Intravenous Contrast Given 03/10/18 0921)     Initial Impression / Assessment and Plan / ED Course  I have reviewed the triage vital signs and the nursing notes.  Pertinent labs & imaging results that were available during my care of the patient were reviewed by me and considered in my medical decision making (see chart for details).  He presents with constipation after lumbar fusion on Tuesday 03-05-2018.  Patient has tried Colace, Dulcolax, and states has not had a bowel movement. Patient began having episodes of emesis yesterday. On examination patient's abdomen seemed rigid and his bowel sounds were decreased.  Will obtain blood work along with CT imaging to rule out any small bowel obstruction, abdominal pathology after his surgery.  CBC showed slight leukocytosis 10.7, hemoglobin is 12.7 decreased from previous but patient had surgery this past week.  MP showed no electrolyte abnormality.  ALT is elevated at 72 increased from patient's last lab results from a year ago. CT abdomen and pelvis showed:  1. Large colonic stool burden without evidence of enteric  obstruction.  2. Sequela of recent L5 laminectomy, L5-S1  paraspinal fusion and  intervertebral disc space replacement with associated adjacent  postoperative change and subcutaneous emphysema. No  definable/drainable fluid collection.   I have tried to fecal disimpact this patient and I was able to remove small amount of stool from patient's bottom.  At this time will discharge patient with a bottle of magnesium Magnesium Citrate prescription along with MiraLAX over-the-counter to help with his constipation.  It is advised to return to the ED if he experiences any fever, worsening symptoms.  Patient is also requesting a refill on his hydrocodone that his surgeon provided for him as he only has 2 pills left, I will provide 2 days worth of this medication for patient as he is postop.  I have advised patient needs to call his surgeon in order to have this medication fully refilled.  Final Clinical Impressions(s) / ED Diagnoses   Final diagnoses:  Constipation, unspecified constipation type  Psychogenic vomiting without nausea    ED Discharge Orders         Ordered    magnesium citrate SOLN   Once     03/10/18 1026    polyethylene glycol (MIRALAX) packet  Daily     03/10/18 1027    oxyCODONE-acetaminophen (PERCOCET/ROXICET) 5-325 MG tablet  Every 4 hours PRN     03/10/18 1028  Claude Manges, PA-C 03/10/18 1031    Vanetta Mulders, MD 03/10/18 1740

## 2018-03-10 NOTE — ED Triage Notes (Signed)
Pt reports had lumbar fusion on Tuesday and hasn't had a BM since Monday.  Reports started vomiting last night.

## 2018-05-02 DIAGNOSIS — Z981 Arthrodesis status: Secondary | ICD-10-CM | POA: Diagnosis not present

## 2018-06-26 DIAGNOSIS — M5137 Other intervertebral disc degeneration, lumbosacral region: Secondary | ICD-10-CM | POA: Diagnosis not present

## 2018-10-08 DIAGNOSIS — S0501XA Injury of conjunctiva and corneal abrasion without foreign body, right eye, initial encounter: Secondary | ICD-10-CM | POA: Diagnosis not present

## 2018-10-09 DIAGNOSIS — S0501XD Injury of conjunctiva and corneal abrasion without foreign body, right eye, subsequent encounter: Secondary | ICD-10-CM | POA: Diagnosis not present

## 2019-01-25 IMAGING — CT CT ABD-PELV W/ CM
2 of 4 series · 15 of 46 positions shown, 17 images · IV contrast (Isovue)
Comparison: None.

CLINICAL DATA: Back surgery 6 days ago now with constipation and
vomiting.

EXAM:
CT ABDOMEN AND PELVIS WITH CONTRAST
TECHNIQUE: Multidetector CT imaging of the abdomen and pelvis was performed
using the standard protocol following bolus administration of
intravenous contrast.
CONTRAST:  100mL 6P2BO4-W22 IOPAMIDOL (6P2BO4-W22) INJECTION 61%

[Series 2: axial (person_name) (person_name) · axial · 0.70mm/px · z∈[+958,+1378]mm · 12 of 96 slices shown, 14 images]
[im 8/96  soft-tissue]
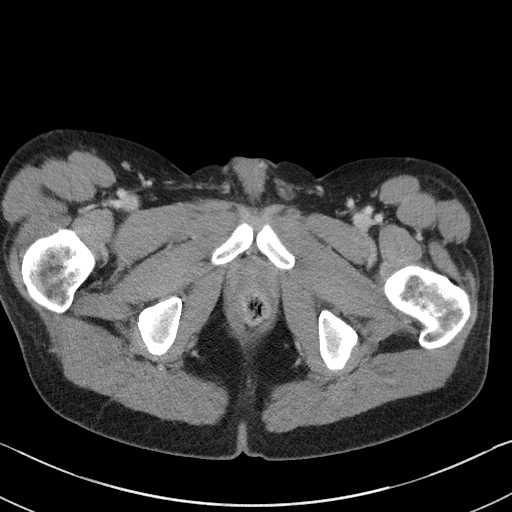
[im 8/96  bone]
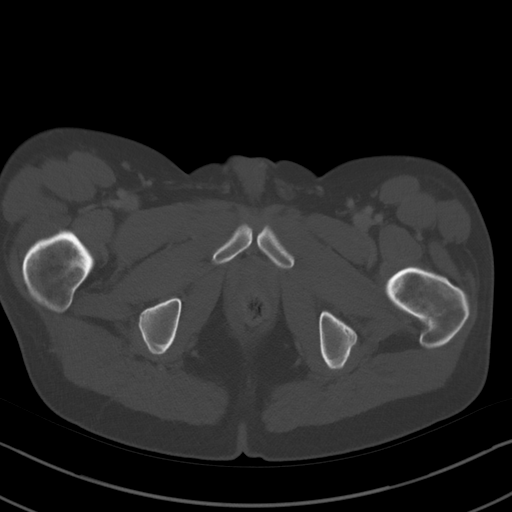
[im 16/96  soft-tissue]
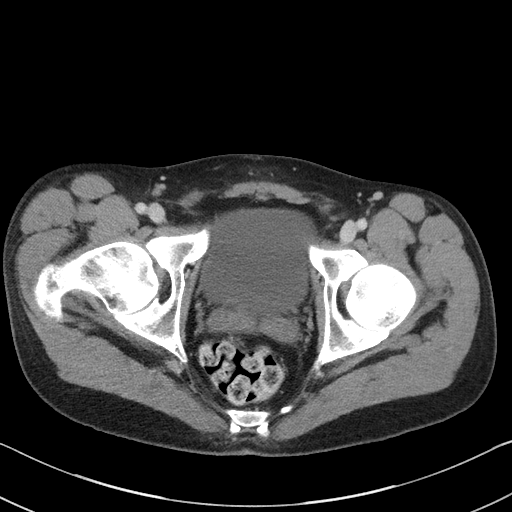
[im 23/96  soft-tissue]
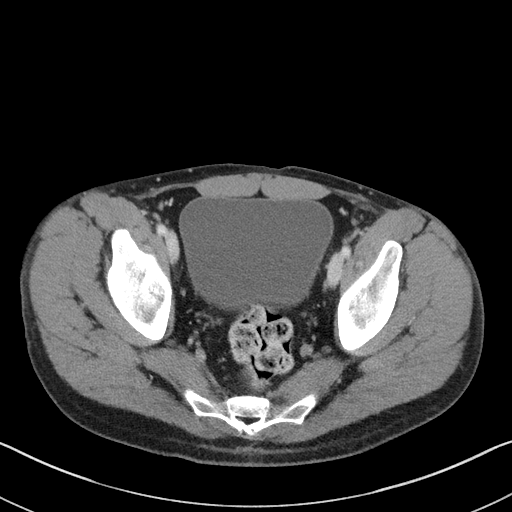
[im 31/96  soft-tissue]
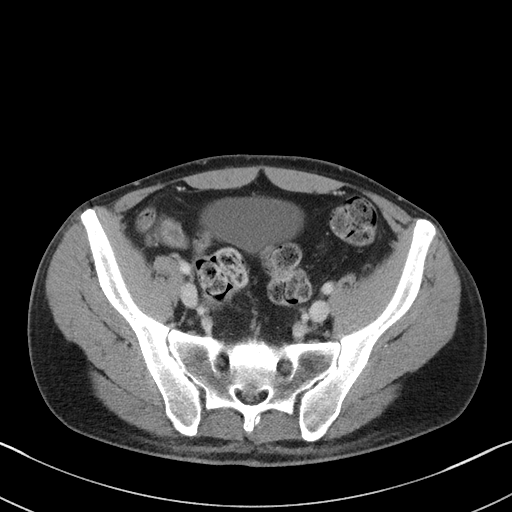
[im 39/96  soft-tissue]
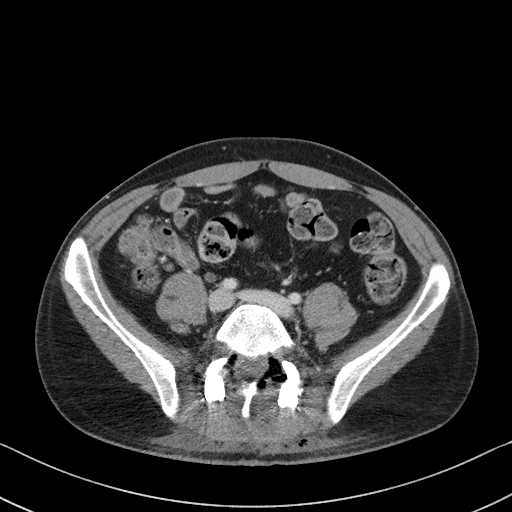
[im 46/96  soft-tissue]
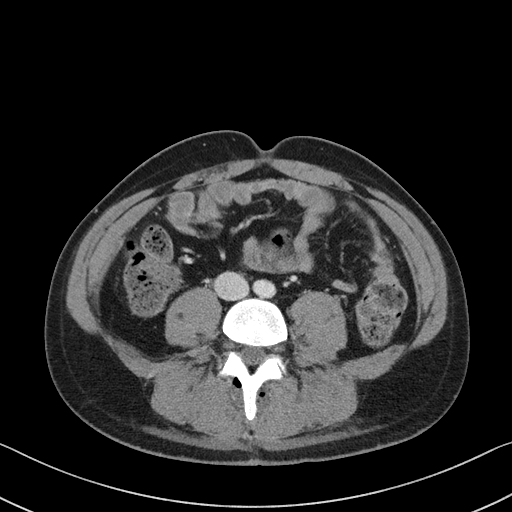
[im 54/96  soft-tissue]
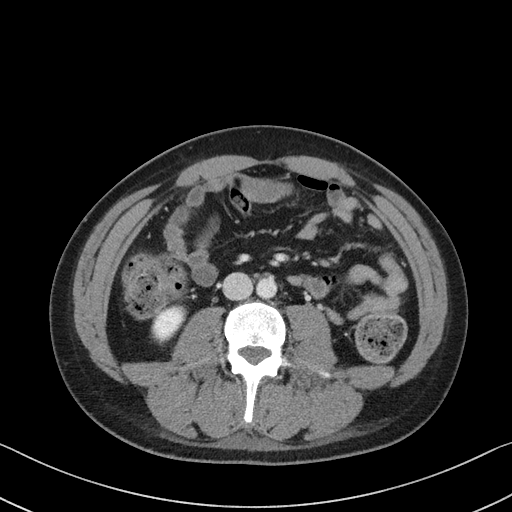
[im 61/96  soft-tissue]
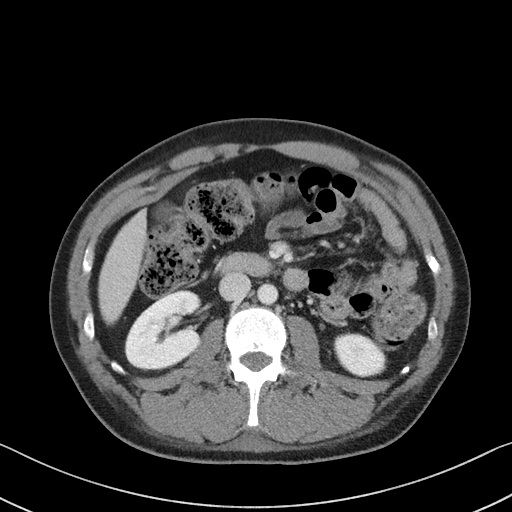
[im 69/96  soft-tissue]
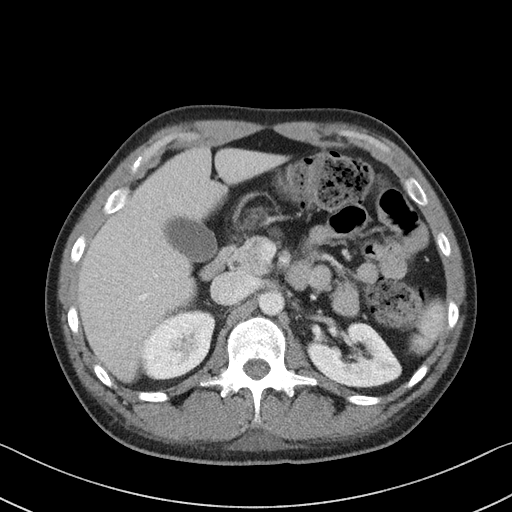
[im 69/96  bone]
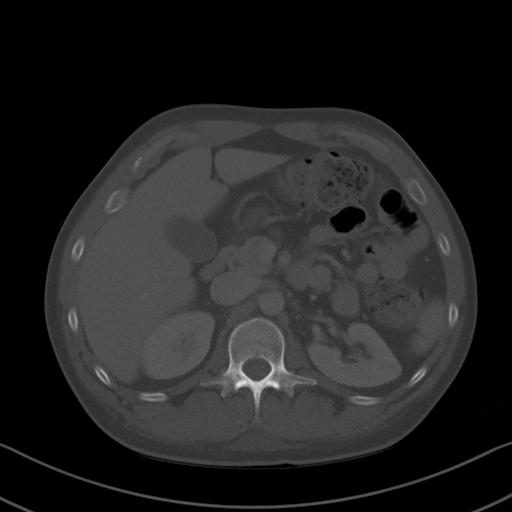
[im 77/96  soft-tissue]
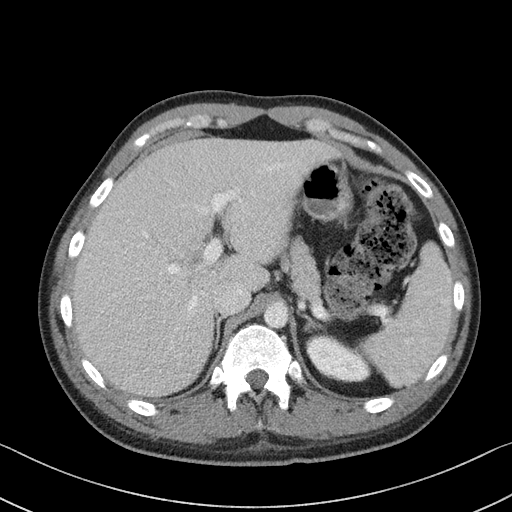
[im 84/96  soft-tissue]
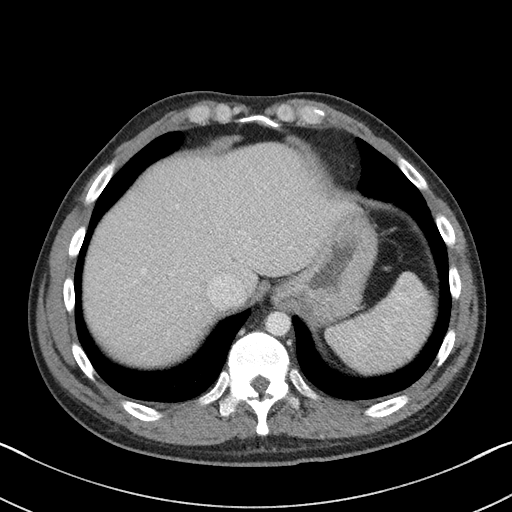
[im 92/96  soft-tissue]
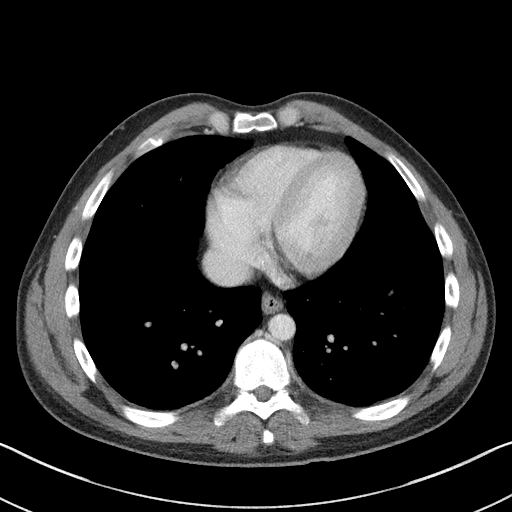

[Series 5: coronal st · coronal · 0.70mm/px · 3 of 99 slices shown]
[im 33/99  soft-tissue]
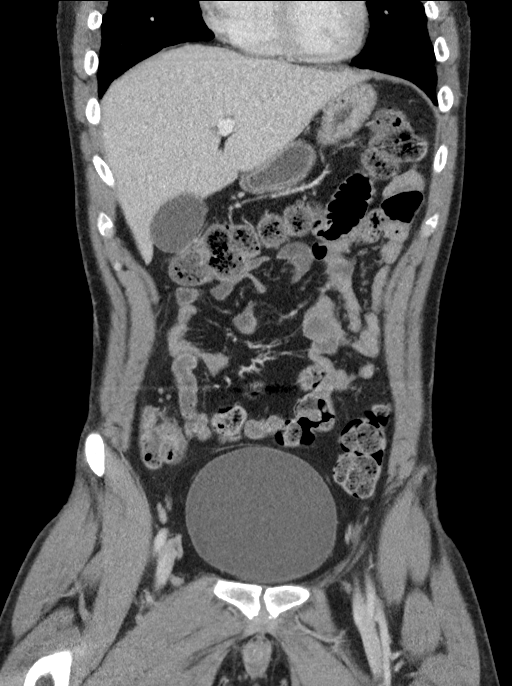
[im 44/99  soft-tissue]
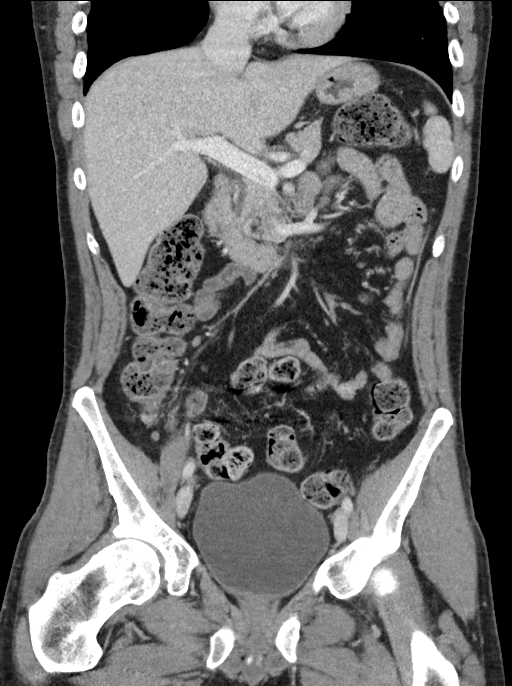
[im 55/99  soft-tissue]
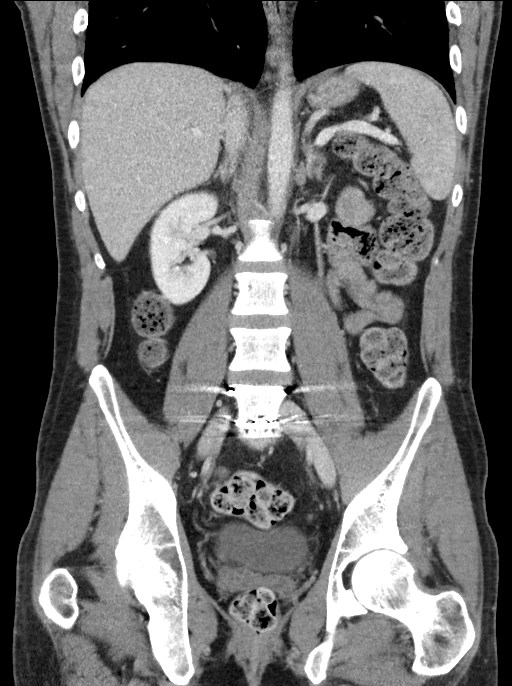

[15 of 46 positions shown; findings below may reference images not displayed]

FINDINGS: Lower chest: Limited visualization of the lower thorax demonstrates
minimal dependent subpleural ground-glass atelectasis. No discrete
focal airspace opacities. No pleural effusion.

Normal heart size.  No pericardial effusion.

Hepatobiliary: Normal hepatic contour. There is a minimal amount of
focal fatty infiltration adjacent to the fissure for the ligamentum
teres. Note is made the freezing calf about the gallbladder. Normal
appearance of the otherwise normal appearance of the gallbladder. No
radiopaque gallstones. No gallbladder wall thickening or
pericholecystic fluid. No ascites.

Pancreas: Normal appearance of the pancreas

Spleen: Normal appearance of the spleen. Note is made of a small
splenule.

Adrenals/Urinary Tract: There is symmetric enhancement of the
bilateral kidneys. No definite renal stones on this postcontrast
examination. No discrete renal lesions. No urine obstruction or
perinephric stranding.

Normal appearance the bilateral adrenal glands.

Normal appearance of the urinary bladder given degree distention.

Stomach/Bowel: Large colonic stool burden without evidence of
enteric obstruction. Normal appearance of the terminal ileum and
retrocecal appendix. The bowel is normal in course and caliber
without wall thickening. No pneumoperitoneum, pneumatosis or portal
venous gas.

Vascular/Lymphatic: Normal caliber of the abdominal aorta. The major
branch vessels of the abdominal aorta appear patent on this non CTA
examination.

No bulky retroperitoneal, mesenteric, pelvic or inguinal
lymphadenopathy.

Reproductive: Normal appearance of the prostate gland. No free fluid
in the pelvic cul-de-sac.

Other: Tiny mesenteric fat containing periumbilical hernia.

Musculoskeletal: Post L5 laminectomy and L5-S1 paraspinal fusion and
intervertebral disc space replacement with subcutaneous stranding
and scattered foci of postoperative subcutaneous emphysema about the
operative site. No definitive definable/drainable fluid collection.
IMPRESSION: 1. Large colonic stool burden without evidence of enteric
obstruction.
2. Sequela of recent L5 laminectomy, L5-S1 paraspinal fusion and
intervertebral disc space replacement with associated adjacent
postoperative change and subcutaneous emphysema. No
definable/drainable fluid collection.

## 2022-05-28 ENCOUNTER — Encounter (HOSPITAL_BASED_OUTPATIENT_CLINIC_OR_DEPARTMENT_OTHER): Payer: Self-pay

## 2022-05-28 ENCOUNTER — Emergency Department (HOSPITAL_BASED_OUTPATIENT_CLINIC_OR_DEPARTMENT_OTHER)
Admission: EM | Admit: 2022-05-28 | Discharge: 2022-05-28 | Disposition: A | Discharge status: Home / Self Care | Payer: 59 | Attending: Emergency Medicine | Admitting: Emergency Medicine

## 2022-05-28 ENCOUNTER — Other Ambulatory Visit: Payer: Self-pay

## 2022-05-28 DIAGNOSIS — S01311A Laceration without foreign body of right ear, initial encounter: Secondary | ICD-10-CM | POA: Diagnosis not present

## 2022-05-28 DIAGNOSIS — S0991XA Unspecified injury of ear, initial encounter: Secondary | ICD-10-CM

## 2022-05-28 DIAGNOSIS — W19XXXA Unspecified fall, initial encounter: Secondary | ICD-10-CM | POA: Diagnosis not present

## 2022-05-28 DIAGNOSIS — S0993XA Unspecified injury of face, initial encounter: Secondary | ICD-10-CM | POA: Diagnosis not present

## 2022-05-28 MED ORDER — CEPHALEXIN 500 MG PO CAPS: 500.0000 mg | ORAL_CAPSULE | Freq: Two times a day (BID) | ORAL | 0 refills | Status: DC

## 2022-05-28 MED ORDER — CEPHALEXIN 500 MG PO CAPS: 500.0000 mg | ORAL_CAPSULE | Freq: Two times a day (BID) | ORAL | 0 refills | Status: AC

## 2022-05-28 NOTE — ED Triage Notes (Signed)
Patient here POV from Home.  Endorses tripping over [employer] and hit Right Ear against Gate Last PM at 0000. Near Separation of Right Ear Lobe. Dressing applied in Triage.    Tetanus UTD.   NAD Noted during Triage. A&Ox4. GCS 15. Ambulatory.

## 2022-05-28 NOTE — ED Provider Notes (Signed)
[facility] EMERGENCY DEPT Provider Note   CSN: [id number] Arrival date & time: 05/28/22  1318     History Chief Complaint  Patient presents with   Ear Injury    HPI Nathan Nichols is a 37 y.o. male presenting for chief complaint of ear injury.  He states that approximately 2 hours prior to arrival he fell and hit his right ear on a trailer.  He had near complete separation.  It is numb.  His significant other glued the ear with superglue prior to coming in today.  They state that they cleaned it thoroughly.  He denies fevers chills nausea vomiting syncope shortness of breath.  He does have some jaw pain as well he has been able to tolerate p.o. intake..   Patient's recorded medical, surgical, social, medication list and allergies were reviewed in the Snapshot window as part of the initial history.   Review of Systems   Review of Systems  Constitutional:  Negative for chills and fever.  HENT:  Negative for ear pain and sore throat.   Eyes:  Negative for pain and visual disturbance.  Respiratory:  Negative for cough and shortness of breath.   Cardiovascular:  Negative for chest pain and palpitations.  Gastrointestinal:  Negative for abdominal pain and vomiting.  Genitourinary:  Negative for dysuria and hematuria.  Musculoskeletal:  Negative for arthralgias and back pain.  Skin:  Negative for color change and rash.  Neurological:  Negative for seizures and syncope.  All other systems reviewed and are negative.   Physical Exam Updated Vital Signs BP 129/88   Pulse 92   Temp 98 F (36.7 C)   Resp 18   Ht 5\' 11"  (1.803 m)   Wt 77.1 kg   SpO2 96%   BMI 23.71 kg/m  Physical Exam Vitals and nursing note reviewed.  Constitutional:      General: He is not in acute distress.    Appearance: He is well-developed.  HENT:     Head: Normocephalic and atraumatic.  Eyes:     Conjunctiva/sclera: Conjunctivae normal.  Cardiovascular:     Rate and Rhythm: Normal  rate and regular rhythm.     Heart sounds: No murmur heard. Pulmonary:     Effort: Pulmonary effort is normal. No respiratory distress.     Breath sounds: Normal breath sounds.  Abdominal:     Palpations: Abdomen is soft.     Tenderness: There is no abdominal tenderness.  Musculoskeletal:        General: Deformity and signs of injury (Right-sided facial swelling over the TMJ.  Near removal of the right earlobe.  Adhesive is in place.) present. No swelling.     Cervical back: Neck supple.  Skin:    General: Skin is warm and dry.     Capillary Refill: Capillary refill takes less than 2 seconds.  Neurological:     Mental Status: He is alert.  Psychiatric:        Mood and Affect: Mood normal.      ED Course/ Medical Decision Making/ A&P    Procedures .Marland KitchenLaceration Repair  Date/Time: 05/28/2022 5:42 PM  Performed by: Tretha Sciara, MD Authorized by: Tretha Sciara, MD   Consent:    Consent obtained:  Verbal   Consent given by:  Patient   Risks, benefits, and alternatives were discussed: yes     Risks discussed:  Infection, poor cosmetic result, pain and need for additional repair Laceration details:    Location:  Ear  Length (cm):  2.6 Pre-procedure details:    Preparation:  Patient was prepped and draped in usual sterile fashion Exploration:    Contaminated: yes (Tissue adhesive in place.)   Skin repair:    Repair method:  Sutures   Suture technique:  Simple interrupted Approximation:    Approximation:  Close Repair type:    Repair type:  Simple Comments:     Complex repair because patient has significant tissue adhesive in place.  I informed her that this can limit my ability to complete primary closure and irrigate and clean wound.  They insist that they have already cleaned the wound thoroughly.  Retention sutures were placed and to help provide undermining for the tissue adhesive which is already in place.    Medications Ordered in ED Medications - No  data to display Medical Decision Making:   Nathan Nichols is a 37 y.o. male who presented to the ED today with a 2.6 cm laceration to their right ear. They are neurologically compromised. Vascular intact. Tetanus is UTD.  Reviewed and confirmed nursing documentation for past medical history, family history, social history.       Assessment:   Laceration that will require repair.?  Given mechanism of injury, underlying fracture, deformity, wound site infection and systemic injury were all considered inconsistent with this current history of present illness and physical exam. Patient does have jaw pain but is allowing me to break a popsicle stick without her teeth without any pain.  Based on mechanism, appearance, I do not believe that this is a jaw fracture. Plan:  Lac repair as noted in procedures Tetanus up-to-date Wound care with standard precautions  and antibiotic ointment  Disposition:  Based on the above findings, I believe patient is stable for discharge.    Patient/family educated about specific return precautions for given chief complaint and symptoms.  Patient/family educated about follow-up with PCP.     Patient/family expressed understanding of return precautions and need for follow-up. Patient spoken to regarding all imaging and laboratory results and appropriate follow up for these results. All education provided in verbal form with additional information in written form. Time was allowed for answering of patient questions. Patient discharged.    Emergency Department Medication Summary:   Medications - No data to display        Clinical Impression:  1. Injury of ear, initial encounter      Discharge   Final Clinical Impression(s) / ED Diagnoses Final diagnoses:  Injury of ear, initial encounter    Rx / DC Orders ED Discharge Orders          Ordered    cephALEXin (KEFLEX) 500 MG capsule  2 times daily        05/28/22 1744              Tretha Sciara, MD 05/28/22 1744

## 2022-12-14 DIAGNOSIS — Z419 Encounter for procedure for purposes other than remedying health state, unspecified: Secondary | ICD-10-CM | POA: Diagnosis not present

## 2023-01-14 DIAGNOSIS — Z419 Encounter for procedure for purposes other than remedying health state, unspecified: Secondary | ICD-10-CM | POA: Diagnosis not present

## 2023-02-13 DIAGNOSIS — Z419 Encounter for procedure for purposes other than remedying health state, unspecified: Secondary | ICD-10-CM | POA: Diagnosis not present

## 2023-03-16 DIAGNOSIS — Z419 Encounter for procedure for purposes other than remedying health state, unspecified: Secondary | ICD-10-CM | POA: Diagnosis not present

## 2023-04-15 DIAGNOSIS — Z419 Encounter for procedure for purposes other than remedying health state, unspecified: Secondary | ICD-10-CM | POA: Diagnosis not present
